# Patient Record
Sex: Female | Born: 2005 | Race: Black or African American | Hispanic: No | Marital: Single | Smoking: Never smoker
Health system: Southern US, Community
[De-identification: ages and names within clinical notes are randomized; demographics above are authoritative.]

## PROBLEM LIST (undated history)

## (undated) ENCOUNTER — Emergency Department (HOSPITAL_COMMUNITY): Discharge status: Against Medical Advice | Payer: Medicaid Other

## (undated) DIAGNOSIS — J45909 Unspecified asthma, uncomplicated: Secondary | ICD-10-CM

## (undated) DIAGNOSIS — T7840XA Allergy, unspecified, initial encounter: Secondary | ICD-10-CM

## (undated) DIAGNOSIS — J302 Other seasonal allergic rhinitis: Secondary | ICD-10-CM

## (undated) DIAGNOSIS — F419 Anxiety disorder, unspecified: Secondary | ICD-10-CM

## (undated) DIAGNOSIS — R040 Epistaxis: Secondary | ICD-10-CM

## (undated) DIAGNOSIS — J353 Hypertrophy of tonsils with hypertrophy of adenoids: Secondary | ICD-10-CM

## (undated) HISTORY — DX: Anxiety disorder, unspecified: F41.9

## (undated) HISTORY — DX: Allergy, unspecified, initial encounter: T78.40XA

---
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
## 2024-03-15 ENCOUNTER — Encounter: Payer: Self-pay | Admitting: *Deleted

## 2024-03-22 DIAGNOSIS — B351 Tinea unguium: Secondary | ICD-10-CM | POA: Diagnosis not present

## 2024-04-29 ENCOUNTER — Encounter: Payer: Self-pay | Admitting: Radiology

## 2024-04-30 ENCOUNTER — Other Ambulatory Visit: Payer: Self-pay

## 2024-04-30 ENCOUNTER — Encounter (HOSPITAL_BASED_OUTPATIENT_CLINIC_OR_DEPARTMENT_OTHER): Payer: Self-pay | Admitting: Emergency Medicine

## 2024-04-30 ENCOUNTER — Emergency Department (HOSPITAL_BASED_OUTPATIENT_CLINIC_OR_DEPARTMENT_OTHER)

## 2024-04-30 ENCOUNTER — Emergency Department (HOSPITAL_BASED_OUTPATIENT_CLINIC_OR_DEPARTMENT_OTHER)
Admission: EM | Admit: 2024-04-30 | Discharge: 2024-04-30 | Disposition: A | Discharge status: Home / Self Care | Attending: Emergency Medicine | Admitting: Emergency Medicine

## 2024-04-30 DIAGNOSIS — J45909 Unspecified asthma, uncomplicated: Secondary | ICD-10-CM | POA: Diagnosis not present

## 2024-04-30 DIAGNOSIS — S93492A Sprain of other ligament of left ankle, initial encounter: Secondary | ICD-10-CM | POA: Diagnosis not present

## 2024-04-30 DIAGNOSIS — S93402A Sprain of unspecified ligament of left ankle, initial encounter: Secondary | ICD-10-CM | POA: Insufficient documentation

## 2024-04-30 DIAGNOSIS — M25572 Pain in left ankle and joints of left foot: Secondary | ICD-10-CM | POA: Diagnosis not present

## 2024-04-30 DIAGNOSIS — X501XXA Overexertion from prolonged static or awkward postures, initial encounter: Secondary | ICD-10-CM | POA: Insufficient documentation

## 2024-04-30 MED ORDER — NAPROXEN 500 MG PO TABS: 500.0000 mg | ORAL_TABLET | Freq: Two times a day (BID) | ORAL | 0 refills | Status: DC

## 2024-04-30 MED ORDER — NAPROXEN 250 MG PO TABS
500.0000 mg | ORAL_TABLET | Freq: Once | ORAL | Status: AC
  Administered 2024-04-30: 500 mg via ORAL
  Filled 2024-04-30: qty 2

## 2024-04-30 NOTE — ED Notes (Signed)
 Patient verbalizes understanding of discharge instructions. Opportunity for questioning and answers were provided. Armband removed by staff, pt discharged from ED. Wheeled out to lobby with friends

## 2024-04-30 NOTE — Discharge Instructions (Addendum)
 You were evaluated in the Emergency Department and after careful evaluation, we did not find any emergent condition requiring admission or further testing in the hospital.  Your exam/testing today is overall reassuring.  Symptoms seem to be due to an ankle sprain.  X-ray did not show any broken bones.  Use the Naprosyn twice daily as needed for pain.  Use the crutches as needed for comfort.  Recommend ice for the next few days.  Try to rest it as we discussed.  Please return to the Emergency Department if you experience any worsening of your condition.   Thank you for allowing us  to be a part of your care.

## 2024-04-30 NOTE — ED Provider Notes (Signed)
 [facility]ENC[facility] Emergency Department Provider Note MRN:  [id number]  Arrival date & time: 04/30/24     Chief Complaint   Ankle Pain   History of Present Illness   Caitlin Terry is a 18 y.o. year-old female with no pertinent past medical history presenting to the ED with chief complaint of ankle pain.  Tripped and rolled her ankle, trouble bearing weight since.  No other injuries.  Review of Systems  A thorough review of systems was obtained and all systems are negative except as noted in the HPI and PMH.   Patient's Health History    Past Medical History:  Diagnosis Date   Allergy    Anxiety    Asthma    prn inhaler   Frequent nosebleeds    Seasonal allergies    Tonsillar and adenoid hypertrophy 03/2016   snores during sleep, mother denies apnea    Past Surgical History:  Procedure Laterality Date   TONSILLECTOMY AND ADENOIDECTOMY N/A 05/24/2016   Procedure: TONSILLECTOMY AND ADENOIDECTOMY;  Surgeon: Daniel Moccasin, MD;  Location: [facility];  Service: ENT;  Laterality: N/A;  TONSILLECTOMY AND ADENOIDECTOMY    Family History  Problem Relation Age of Onset   Diabetes Paternal Grandmother    Hypertension Paternal Grandmother    Hypertension Maternal Grandfather    Hypertension Father    Asthma Sister     Social History   Socioeconomic History   Marital status: Single    Spouse name: Not on file   Number of children: Not on file   Years of education: Not on file   Highest education level: GED or equivalent  Occupational History   Not on file  Tobacco Use   Smoking status: Never   Smokeless tobacco: Never  Vaping Use   Vaping status: Never Used  Substance and Sexual Activity   Alcohol use: Not Currently    Comment: occasional   Drug use: Not Currently   Sexual activity: Not Currently    Birth control/protection: None  Other Topics Concern   Not on file  Social History Narrative   Lives with parents, siblings   No smokers    Works at [employer]   Social Drivers of Health   Financial Resource Strain: Low Risk  (12/09/2023)   Overall Financial Resource Strain (CARDIA)    Difficulty of Paying Living Expenses: Not hard at all  Food Insecurity: No Food Insecurity (12/09/2023)   Hunger Vital Sign    Worried About Running Out of Food in the Last Year: Never true    Ran Out of Food in the Last Year: Never true  Transportation Needs: No Transportation Needs (12/09/2023)   PRAPARE - Administrator, Civil Service (Medical): No    Lack of Transportation (Non-Medical): No  Physical Activity: Sufficiently Active (12/09/2023)   Exercise Vital Sign    Days of Exercise per Week: 4 days    Minutes of Exercise per Session: 60 min  Stress: Stress Concern Present (12/09/2023)   [name] of Occupational Health - Occupational Stress Questionnaire    Feeling of Stress: To some extent  Social Connections: Moderately Integrated (12/09/2023)   Social Connection and Isolation Panel    Frequency of Communication with Friends and Family: More than three times a week    Frequency of Social Gatherings with Friends and Family: Twice a week    Attends Religious Services: More than 4 times per year    Active Member of Clubs or  Organizations: Yes    Attends Banker Meetings: 1 to 4 times per year    Marital Status: Never married  Intimate Partner Violence: Not At Risk (01/27/2021)   Humiliation, Afraid, Rape, and Kick questionnaire    Fear of Current or Ex-Partner: No    Emotionally Abused: No    Physically Abused: No    Sexually Abused: No     Physical Exam   Vitals:   04/30/24 0108  BP: 133/88  Pulse: 70  Resp: 16  Temp: 98 F (36.7 C)  SpO2: 99%    CONSTITUTIONAL: Well-appearing, NAD NEURO/PSYCH:  Alert and oriented x 3, no focal deficits EYES:  eyes equal and reactive ENT/NECK:  no LAD, no JVD CARDIO: Regular rate, well-perfused, normal S1 and S2 PULM:  CTAB no wheezing or  rhonchi GI/GU:  non-distended, non-tender MSK/SPINE:  No gross deformities, no edema SKIN:  no rash, atraumatic   *Additional and/or pertinent findings included in MDM below  Diagnostic and Interventional Summary    EKG Interpretation Date/Time:    Ventricular Rate:    PR Interval:    QRS Duration:    QT Interval:    QTC Calculation:   R Axis:      Text Interpretation:         Labs Reviewed - No data to display  DG Ankle Complete Left  Final Result      Medications  naproxen (NAPROSYN) tablet 500 mg (has no administration in time range)     Procedures  /  Critical Care Procedures  ED Course and Medical Decision Making  Initial Impression and Ddx Swelling to the lateral malleolus of the left ankle, no other significant tenderness to the lower leg or foot.  Past medical/surgical history that increases complexity of ED encounter: None  Interpretation of Diagnostics I personally reviewed the ankle x-ray and my interpretation is as follows: No fracture    Patient Reassessment and Ultimate Disposition/Management     Foot is neurovasc intact, no emergent process, appropriate for discharge.  Patient management required discussion with the following services or consulting groups:  None  Complexity of Problems Addressed Acute complicated illness or Injury  Additional Data Reviewed and Analyzed Further history obtained from: Further history from spouse/family member  Additional Factors Impacting ED Encounter Risk Prescriptions  Ozell HERO. Theadore, MD [facility] Emergency Medicine [facility] [email or web address]  Final Clinical Impressions(s) / ED Diagnoses     ICD-10-CM   1. Sprain of left ankle, unspecified ligament, initial encounter  D06.597J       ED Discharge Orders          Ordered    naproxen (NAPROSYN) 500 MG tablet  2 times daily        04/30/24 0411             Discharge Instructions Discussed with and Provided to  Patient:    Discharge Instructions      You were evaluated in the Emergency Department and after careful evaluation, we did not find any emergent condition requiring admission or further testing in the hospital.  Your exam/testing today is overall reassuring.  Symptoms seem to be due to an ankle sprain.  X-ray did not show any broken bones.  Use the Naprosyn twice daily as needed for pain.  Use the crutches as needed for comfort.  Recommend ice for the next few days.  Try to rest it as we discussed.  Please return to the Emergency Department if you experience  any worsening of your condition.   Thank you for allowing us  to be a part of your care.      Theadore Ozell HERO, MD 04/30/24 [phone]

## 2024-04-30 NOTE — ED Triage Notes (Signed)
 Pt in with L ankle pain and swelling - occurred tonight while playing, states someone fell on it and pain began immediately. Limited ROM

## 2024-05-30 ENCOUNTER — Other Ambulatory Visit: Payer: Medicaid Other

## 2024-05-30 DIAGNOSIS — Z0000 Encounter for general adult medical examination without abnormal findings: Secondary | ICD-10-CM

## 2024-05-31 LAB — LIPID PANEL
Cholesterol: 135 mg/dL (ref <170)
HDL: 55 mg/dL (ref >45)
LDL Cholesterol (Calc): 68 mg/dL (ref <110)
Non-HDL Cholesterol (Calc): 80 mg/dL (ref <120)
Total CHOL/HDL Ratio: 2.5 (calc) (ref <5.0)
Triglycerides: 41 mg/dL (ref <90)

## 2024-05-31 LAB — CBC WITH DIFFERENTIAL/PLATELET
Absolute Lymphocytes: 2443 {cells}/uL (ref 850–3900)
Absolute Monocytes: 641 {cells}/uL (ref 200–950)
Basophils Absolute: 42 {cells}/uL (ref 0–200)
Basophils Relative: 0.8 %
Eosinophils Absolute: 69 {cells}/uL (ref 15–500)
Eosinophils Relative: 1.3 %
HCT: 39.8 % (ref 35.9–46.0)
Hemoglobin: 12.7 g/dL (ref 11.7–15.5)
MCH: 27.9 pg (ref 27.0–33.0)
MCHC: 31.9 g/dL (ref 31.6–35.4)
MCV: 87.3 fL (ref 81.4–101.7)
MPV: 11 fL (ref 7.5–12.5)
Monocytes Relative: 12.1 %
Neutro Abs: 2104 {cells}/uL (ref 1500–7800)
Neutrophils Relative %: 39.7 %
Platelets: 291 Thousand/uL (ref 140–400)
RBC: 4.56 Million/uL (ref 3.80–5.10)
RDW: 12 % (ref 11.0–15.0)
Total Lymphocyte: 46.1 %
WBC: 5.3 Thousand/uL (ref 3.8–10.8)

## 2024-05-31 LAB — COMPREHENSIVE METABOLIC PANEL WITH GFR
AG Ratio: 2 (calc) (ref 1.0–2.5)
ALT: 9 U/L (ref 5–32)
AST: 29 U/L (ref 12–32)
Albumin: 4.7 g/dL (ref 3.6–5.1)
Alkaline phosphatase (APISO): 86 U/L (ref 36–128)
BUN: 7 mg/dL (ref 7–20)
CO2: 27 mmol/L (ref 20–32)
Calcium: 9.5 mg/dL (ref 8.9–10.4)
Chloride: 105 mmol/L (ref 98–110)
Creat: 0.85 mg/dL (ref 0.50–0.96)
Globulin: 2.3 g/dL (ref 2.0–3.8)
Glucose, Bld: 99 mg/dL (ref 65–99)
Potassium: 4.2 mmol/L (ref 3.8–5.1)
Sodium: 140 mmol/L (ref 135–146)
Total Bilirubin: 0.3 mg/dL (ref 0.2–1.1)
Total Protein: 7 g/dL (ref 6.3–8.2)
eGFR: 101 mL/min/1.73m2 (ref >=60)

## 2024-05-31 LAB — TSH: TSH: 1.52 m[IU]/L

## 2024-06-03 ENCOUNTER — Ambulatory Visit: Payer: Medicaid Other | Admitting: Family Medicine

## 2024-06-03 ENCOUNTER — Encounter: Payer: Self-pay | Admitting: Family Medicine

## 2024-06-03 ENCOUNTER — Telehealth: Payer: Self-pay

## 2024-06-03 VITALS — BP 111/65 | HR 75 | Temp 98.0°F | Ht 67.52 in | Wt 124.4 lb

## 2024-06-03 DIAGNOSIS — Z0000 Encounter for general adult medical examination without abnormal findings: Secondary | ICD-10-CM

## 2024-06-03 DIAGNOSIS — B360 Pityriasis versicolor: Secondary | ICD-10-CM | POA: Diagnosis not present

## 2024-06-03 DIAGNOSIS — F410 Panic disorder [episodic paroxysmal anxiety] without agoraphobia: Secondary | ICD-10-CM

## 2024-06-03 DIAGNOSIS — Z0001 Encounter for general adult medical examination with abnormal findings: Secondary | ICD-10-CM | POA: Diagnosis not present

## 2024-06-03 DIAGNOSIS — F411 Generalized anxiety disorder: Secondary | ICD-10-CM | POA: Diagnosis not present

## 2024-06-03 MED ORDER — ESCITALOPRAM OXALATE 10 MG PO TABS: 15.0000 mg | ORAL_TABLET | Freq: Every day | ORAL | 1 refills | Status: AC

## 2024-06-03 MED ORDER — KETOCONAZOLE 2 % EX SHAM: MEDICATED_SHAMPOO | Freq: Every day | CUTANEOUS | 11 refills | Status: AC

## 2024-06-03 NOTE — Progress Notes (Signed)
 Complete physical exam  Patient: Caitlin Terry    DOB: 27-Feb-2006 18 y.o.   MRN: [id number]  Chief Complaint  Patient presents with   Annual Exam    Multiple dark spots on back she is concerned about and would like evaluated  Wants to speak on increasing dosage of lexapro      Subjective:    Caitlin Terry is a 18 y.o. female who presents today for a complete physical exam. She reports consuming a general diet. The patient does not participate in regular exercise at present. She generally feels well. She reports sleeping poorly. She does not have additional problems to discuss today.   PMH includes anxiety.  Discussed the use of AI scribe software for clinical note transcription with the patient, who gave verbal consent to proceed.  History of Present Illness Caitlin Terry is a 18 year old female who presents for complete physical.  She experiences ongoing anxiety despite her current medication regimen, which includes Wellbutrin  and Lexapro  at a dose of 10 mg. Her anxiety is somewhat manageable but intensifies during activities like driving. The addition of Wellbutrin  has improved her symptoms, yet she still feels 'frantic' and 'worrying' at times. She attends virtual therapy sessions once a week through [facility].  She has sleep disturbances, particularly difficulty falling asleep despite feeling tired, and does not get eight hours of sleep. No trouble staying asleep once she falls asleep and has not used any sleep aids like melatonin.  She has a history of back pain, which has improved, but she has noticed an increase in dark spots on her back. She has been taking terbinafine for over a month for toenail fungus, prescribed by her podiatrist for onychomycosis, Dr. Burt. She experiences nausea after taking the medication, regardless of food or water intake, and the spots on her back have not improved.  No shortness of breath, chest pain, abdominal issues, or  irregular periods. Regular vision and hearing check-ups and her glasses are up to date. She has not seen a dentist recently due to difficulty finding one accepting new patients.  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   Most recent fall risk assessment:    06/03/2024   10:28 AM  Fall Risk   Falls in the past year? 0  Number falls in past yr: 0  Injury with Fall? 0  Risk for fall due to : No Fall Risks  Follow up Falls evaluation completed     Most recent depression screenings:    06/03/2024   10:28 AM 12/13/2023    9:39 AM  PHQ 2/9 Scores  PHQ - 2 Score 2 2  PHQ- 9 Score 9 6      Data saved with a previous flowsheet row definition    Vision:Within last year and Dental: No current dental problems and No regular dental care  Patient Active Problem List   Diagnosis Date Noted   Tinea versicolor 06/03/2024   Generalized anxiety disorder with panic attacks 07/20/2023   Physical exam, annual 06/01/2023   Past Medical History:  Diagnosis Date   Allergy    Anxiety    Asthma    prn inhaler   Frequent nosebleeds    Seasonal allergies    Tonsillar and adenoid hypertrophy 03/2016   snores during sleep, mother denies apnea   Past Surgical History:  Procedure Laterality Date   TONSILLECTOMY  AND ADENOIDECTOMY N/A 05/24/2016   Procedure: TONSILLECTOMY AND ADENOIDECTOMY;  Surgeon: Daniel Moccasin, MD;  Location: [facility];  Service: ENT;  Laterality: N/A;  TONSILLECTOMY AND ADENOIDECTOMY   Social History   Tobacco Use   Smoking status: Never   Smokeless tobacco: Never  Vaping Use   Vaping status: Never Used  Substance Use Topics   Alcohol use: Not Currently    Comment: occasional   Drug use: Not Currently   Family History  Problem Relation Age of Onset   Diabetes Paternal Grandmother    Hypertension Paternal Grandmother    Hypertension Maternal  Grandfather    Hypertension Father    Asthma Sister    Allergies  Allergen Reactions   Penicillins Rash and Shortness Of Breath    DID THE REACTION INVOLVE: Swelling of the face/tongue/throat, SOB, or low BP? Yes  Sudden or severe rash/hives, skin peeling, or the inside of the mouth or nose? No  Did it require medical treatment? No  When did it last happen?        If all above answers are NO, may proceed with cephalosporin use.      Patient Care Team: Kayla Jeoffrey RAMAN, FNP as PCP - General (Family Medicine)   Review of Systems  Constitutional: Negative.   HENT: Negative.    Eyes: Negative.   Respiratory: Negative.    Cardiovascular: Negative.   Gastrointestinal: Negative.   Genitourinary: Negative.   Musculoskeletal: Negative.   Skin:  Positive for rash.  Neurological: Negative.   Endo/Heme/Allergies: Negative.   Psychiatric/Behavioral:  The patient is nervous/anxious.   All other systems reviewed and are negative.     Objective:    BP 111/65   Pulse 75   Temp 98 F (36.7 C)   Ht 5' 7.52 (1.715 m)   Wt 124 lb 6.4 oz (56.4 kg)   LMP 05/11/2024 (Approximate)   SpO2 97%   BMI 19.18 kg/m  BP Readings from Last 3 Encounters:  06/03/24 111/65  04/30/24 133/88  12/23/23 121/77   Wt Readings from Last 3 Encounters:  06/03/24 124 lb 6.4 oz (56.4 kg) (43%, Z= -0.17)*  04/30/24 129 lb 13.6 oz (58.9 kg) (54%, Z= 0.10)*  12/22/23 129 lb 12.8 oz (58.9 kg) (56%, Z= 0.14)*   * Growth percentiles are based on CDC (Girls, 2-20 Years) data.      Physical Exam Vitals and nursing note reviewed.  Constitutional:      Appearance: Normal appearance. She is normal weight.  HENT:     Head: Normocephalic and atraumatic.     Right Ear: Tympanic membrane, ear canal and external ear normal.     Left Ear: Tympanic membrane, ear canal and external ear normal.     Nose: Nose normal.     Mouth/Throat:     Mouth: Mucous membranes are moist.     Pharynx: Oropharynx is clear.   Eyes:     Extraocular Movements: Extraocular movements intact.     Conjunctiva/sclera: Conjunctivae normal.     Pupils: Pupils are equal, round, and reactive to light.  Cardiovascular:     Rate and Rhythm: Normal rate and regular rhythm.     Pulses: Normal pulses.     Heart sounds: Normal heart sounds.  Pulmonary:     Effort: Pulmonary effort is normal.     Breath sounds: Normal breath sounds.  Abdominal:     General: Bowel sounds are normal.     Palpations: Abdomen is soft.  Musculoskeletal:  General: Normal range of motion.     Cervical back: Normal range of motion and neck supple.  Skin:    General: Skin is warm and dry.     Capillary Refill: Capillary refill takes less than 2 seconds.     Findings: Rash present. Rash is macular.         Comments: Hyperpigmented macules  Neurological:     General: No focal deficit present.     Mental Status: She is alert and oriented to person, place, and time. Mental status is at baseline.  Psychiatric:        Mood and Affect: Mood normal.        Behavior: Behavior normal.        Thought Content: Thought content normal.        Judgment: Judgment normal.       No results found for any visits on 06/03/24. Last CBC Lab Results  Component Value Date   WBC 5.3 05/30/2024   HGB 12.7 05/30/2024   HCT 39.8 05/30/2024   MCV 87.3 05/30/2024   MCH 27.9 05/30/2024   RDW 12.0 05/30/2024   PLT 291 05/30/2024   Last metabolic panel Lab Results  Component Value Date   GLUCOSE 99 05/30/2024   NA 140 05/30/2024   K 4.2 05/30/2024   CL 105 05/30/2024   CO2 27 05/30/2024   BUN 7 05/30/2024   CREATININE 0.85 05/30/2024   EGFR 101 05/30/2024   CALCIUM 9.5 05/30/2024   PROT 7.0 05/30/2024   ALBUMIN 4.2 12/22/2023   BILITOT 0.3 05/30/2024   ALKPHOS 102 12/22/2023   AST 29 05/30/2024   ALT 9 05/30/2024   ANIONGAP 10 12/22/2023   Last lipids Lab Results  Component Value Date   CHOL 135 05/30/2024   HDL 55 05/30/2024   LDLCALC  68 05/30/2024   TRIG 41 05/30/2024   CHOLHDL 2.5 05/30/2024   Last hemoglobin A1c No results found for: HGBA1C Last thyroid  functions Lab Results  Component Value Date   TSH 1.52 05/30/2024   FREET4 1.10 10/17/2023   Last vitamin D  No results found for: 25OHVITD2, 25OHVITD3, VD25OH Last vitamin B12 and Folate No results found for: VITAMINB12, FOLATE       Assessment & Plan:    Routine Health Maintenance and Physical Exam Immunization History  Administered Date(s) Administered   DTaP 12/27/2004, 03/01/2005, 05/09/2005, 11/04/2005, 03/16/2006, 03/03/2010   HIB (PRP-OMP) 12/27/2004, 03/01/2005, 11/04/2005   HPV 9-valent 03/10/2016, 09/21/2016   Hepatitis A, Ped/Adol-2 Dose 03/10/2016, 09/21/2016   Hepatitis B 10/26/2004, 12/27/2004, 03/01/2005, 05/09/2005, 11/04/2005   IPV 12/27/2004, 03/01/2005, 05/09/2005, 11/04/2005, 03/03/2010   Influenza Whole 04/14/2006, 03/21/2008   MMR 11/04/2005, 03/03/2010   MenQuadfi_Meningococcal Groups ACYW Conjugate 03/15/2021   Meningococcal B, OMV 03/15/2021, 06/01/2023   Meningococcal Conjugate 03/10/2016   Pneumococcal Conjugate-13 12/27/2004, 03/01/2005, 05/09/2005, 03/16/2006   Tdap 03/10/2016   Varicella 11/04/2005, 03/10/2016    Health Maintenance  Topic Date Due   COVID-19 Vaccine (1 - 2025-26 season) 06/19/2024 (Originally 02/26/2024)   Influenza Vaccine  09/24/2024 (Originally 01/26/2024)   CHLAMYDIA SCREENING  07/04/2024   DTaP/Tdap/Td (7 - Td or Tdap) 03/10/2026   Pneumococcal Vaccine  Completed   Hepatitis B Vaccines 19-59 Average Risk  Completed   HPV VACCINES  Completed   Hepatitis C Screening  Completed   HIV Screening  Completed   Meningococcal B Vaccine  Completed    Discussed health benefits of physical activity, and encouraged her to engage in regular exercise appropriate for her  age and condition.  Problem List Items Addressed This Visit     Physical exam, annual - Primary   Generalized anxiety  disorder with panic attacks   Relevant Medications   escitalopram  (LEXAPRO ) 10 MG tablet   Tinea versicolor   Relevant Medications   ciclopirox  (PENLAC ) 8 % solution   terbinafine (LAMISIL) 250 MG tablet   ketoconazole  (NIZORAL ) 2 % shampoo    Assessment and Plan Assessment & Plan Generalized anxiety disorder with panic attacks Anxiety partially controlled with current medication. Improvement with Wellbutrin  noted, but significant anxiety persists, especially during driving. Sleep disturbances possibly related to Wellbutrin . - Increase Lexapro  to 15 mg daily. - Continue Wellbutrin  as prescribed. - Try melatonin for sleep disturbances. - Maintain healthy sleep hygiene. - Follow up with psychiatrist every six months.  Tinea versicolor Dark spots on back consistent with tinea versicolor. No improvement with oral terbinafine, experiencing nausea and intolerance. - Prescribe antifungal shampoo for back for three weeks. - Contact podiatrist regarding side effects of Terbinafide and alternatives. - Consider red light therapy for toenail condition.  General Health Maintenance Vaccinations up to date. No recent dental visit due to difficulty finding a dentist. - Encourage finding a dentist for regular check-ups. - Discussed flu vaccine option, declined.    Return in about 6 months (around 12/02/2024) for anxiety/depression, follow-up.    Jeoffrey GORMAN Barrio, FNP [facility]

## 2024-06-03 NOTE — Telephone Encounter (Signed)
 Copied from CRM #[id number]. Topic: Clinical - Prescription Issue >> Jun 03, 2024 11:02 AM Caitlin Terry wrote: Reason for CRM: Received call from [place], with [facility], regarding prescription for medication, ketoconazole .  Would like a return call to clarify directions and type, wants to know if medication needs to be shampoo, ointment or cream.  Aware of same day call back.   Callback Ph. [phone]   [facility] [place], [place] - [address] [place] [address] Phone: [phone] Fax: [phone]

## 2024-07-04 ENCOUNTER — Encounter (HOSPITAL_COMMUNITY): Payer: Self-pay | Admitting: Emergency Medicine

## 2024-07-04 ENCOUNTER — Emergency Department (HOSPITAL_COMMUNITY)
Admission: EM | Admit: 2024-07-04 | Discharge: 2024-07-04 | Disposition: A | Discharge status: Home / Self Care | Attending: Emergency Medicine | Admitting: Emergency Medicine

## 2024-07-04 ENCOUNTER — Other Ambulatory Visit: Payer: Self-pay

## 2024-07-04 DIAGNOSIS — R059 Cough, unspecified: Secondary | ICD-10-CM | POA: Diagnosis present

## 2024-07-04 DIAGNOSIS — J029 Acute pharyngitis, unspecified: Secondary | ICD-10-CM | POA: Insufficient documentation

## 2024-07-04 LAB — GROUP A STREP BY PCR: Group A Strep by PCR: NOT DETECTED

## 2024-07-04 MED ORDER — ACETAMINOPHEN 325 MG PO TABS
650.0000 mg | ORAL_TABLET | Freq: Once | ORAL | Status: AC
  Administered 2024-07-04: 650 mg via ORAL
  Filled 2024-07-04: qty 2

## 2024-07-04 NOTE — Discharge Instructions (Addendum)
 Tylenol  or ibuprofen  every 4 hours.  Make sure you drink plenty of fluids.  Return to the emergency department if symptoms worsen or change.

## 2024-07-04 NOTE — ED Provider Notes (Signed)
 " Caitlin Terry EMERGENCY DEPARTMENT AT [facility] Provider Note   CSN: [id number] Arrival date & time: 07/04/24  1012     Patient presents with: Sore Throat   Caitlin Terry is a 19 y.o. female.   Patient complains of a sore throat, fever and chills.  Patient reports his symptoms started yesterday.  Patient complains of difficulty swallowing today.  Patient reports possible exposure to viral illnesses.  Patient has had a slight cough.  Patient denies any nausea or vomiting she is not having any abdominal pain.  Patient reports she has pain in her throat with swallowing.  The history is provided by the patient. No language interpreter was used.  Sore Throat This is a new problem. The current episode started 2 days ago. The problem occurs constantly. Pertinent negatives include no chest pain and no abdominal pain. Nothing aggravates the symptoms. She has tried nothing for the symptoms. The treatment provided no relief.       Prior to Admission medications  Medication Sig Start Date End Date Taking? Authorizing Provider  buPROPion  ER (WELLBUTRIN  SR) 100 MG 12 hr tablet Take 1 tablet (100 mg total) by mouth daily. 10/17/23 10/16/24  Ezzard Staci SAILOR, NP  ciclopirox  (PENLAC ) 8 % solution Apply 8 % topically at bedtime. 03/22/24   [provider]  escitalopram  (LEXAPRO ) 10 MG tablet Take 1.5 tablets (15 mg total) by mouth daily. 06/03/24   Kayla Jeoffrey RAMAN, FNP  hydrOXYzine  (VISTARIL ) 25 MG capsule Take 1 capsule (25 mg total) by mouth every 8 (eight) hours as needed for anxiety. 07/20/23   Kayla Jeoffrey RAMAN, FNP  ketoconazole  (NIZORAL ) 2 % shampoo Apply topically daily. Apply to affected area daily for 1 to 3 weeks. 06/03/24   Kayla Jeoffrey RAMAN, FNP  terbinafine (LAMISIL) 250 MG tablet Take 250 mg by mouth daily. 04/18/24   [provider]    Allergies: Penicillins    Review of Systems  Cardiovascular:  Negative for chest pain.  Gastrointestinal:  Negative for abdominal  pain.  All other systems reviewed and are negative.   Updated Vital Signs BP 137/86   Pulse 80   Temp 98.6 F (37 C) (Oral)   Resp 18   Ht 5' 7 (1.702 m)   Wt 59 kg   SpO2 100%   BMI 20.36 kg/m   Physical Exam Vitals and nursing note reviewed.  Constitutional:      Appearance: She is well-developed.  HENT:     Head: Normocephalic.     Right Ear: Tympanic membrane normal.     Left Ear: Tympanic membrane normal.     Mouth/Throat:     Pharynx: Posterior oropharyngeal erythema present.  Cardiovascular:     Rate and Rhythm: Normal rate.  Pulmonary:     Effort: Pulmonary effort is normal.  Abdominal:     General: There is no distension.  Musculoskeletal:        General: Normal range of motion.     Cervical back: Normal range of motion.  Skin:    General: Skin is warm.  Neurological:     General: No focal deficit present.     Mental Status: She is alert and oriented to person, place, and time.  Psychiatric:        Mood and Affect: Mood normal.     (all labs ordered are listed, but only abnormal results are displayed) Labs Reviewed  GROUP A STREP BY PCR    EKG: None  Radiology: No results  found.   Procedures   Medications Ordered in the ED - No data to display                                  Medical Decision Making Patient complains of a sore throat fever and chills.  Amount and/or Complexity of Data Reviewed Labs: ordered. Decision-making details documented in ED Course.    Details: Labs ordered reviewed and interpreted strep screen is negative.  Risk Risk Details: I suspect viral etiology.  Possible COVID.  Patient is counseled on symptomatic treatment.  She is advised Tylenol  or ibuprofen  every 4 hours warm salt water gargles lozenges.  Patient is advised to recheck with her primary care physician if symptoms persist.        Final diagnoses:  Pharyngitis, unspecified etiology    ED Discharge Orders     None      An After Visit  Summary was printed and given to the patient.     Sanaz Scarlett K, PA-C 07/04/24 1142    Yolande Lamar BROCKS, MD 07/05/24 1805  "

## 2024-07-04 NOTE — ED Triage Notes (Signed)
 Pt c/o of a sore throat x2 days and chills.

## 2024-12-09 ENCOUNTER — Ambulatory Visit: Admitting: Family Medicine
